# Patient Record
Sex: Female | Born: 1970 | Race: White | Hispanic: No | Marital: Single | Smoking: Never smoker
Health system: Southern US, Community
[De-identification: ages and names within clinical notes are randomized; demographics above are authoritative.]

## PROBLEM LIST (undated history)

## (undated) DIAGNOSIS — F419 Anxiety disorder, unspecified: Secondary | ICD-10-CM

## (undated) DIAGNOSIS — R609 Edema, unspecified: Secondary | ICD-10-CM

## (undated) DIAGNOSIS — C801 Malignant (primary) neoplasm, unspecified: Secondary | ICD-10-CM

## (undated) HISTORY — PX: CHOLECYSTECTOMY: SHX55

## (undated) HISTORY — PX: LAPAROSCOPIC GASTRIC SLEEVE RESECTION: SHX5895

## (undated) HISTORY — PX: KIDNEY CYST REMOVAL: SHX684

---

## 2008-10-05 ENCOUNTER — Ambulatory Visit: Payer: Self-pay | Admitting: Cardiology

## 2009-06-15 ENCOUNTER — Ambulatory Visit (HOSPITAL_COMMUNITY): Admission: RE | Admit: 2009-06-15 | Discharge: 2009-06-15 | Payer: Self-pay | Admitting: Urology

## 2009-06-22 ENCOUNTER — Encounter (INDEPENDENT_AMBULATORY_CARE_PROVIDER_SITE_OTHER): Payer: Self-pay | Admitting: Urology

## 2009-06-22 ENCOUNTER — Inpatient Hospital Stay (HOSPITAL_COMMUNITY): Admission: RE | Admit: 2009-06-22 | Discharge: 2009-06-25 | Payer: Self-pay | Admitting: Urology

## 2009-12-11 ENCOUNTER — Ambulatory Visit (HOSPITAL_COMMUNITY): Admission: RE | Admit: 2009-12-11 | Discharge: 2009-12-11 | Payer: Self-pay | Admitting: Urology

## 2010-07-07 ENCOUNTER — Ambulatory Visit (HOSPITAL_COMMUNITY): Admission: RE | Admit: 2010-07-07 | Discharge: 2010-07-07 | Payer: Self-pay | Admitting: Urology

## 2010-12-09 LAB — BASIC METABOLIC PANEL
BUN: 9 mg/dL (ref 6–23)
CO2: 29 mEq/L (ref 19–32)
Calcium: 8.4 mg/dL (ref 8.4–10.5)
Calcium: 8.6 mg/dL (ref 8.4–10.5)
Creatinine, Ser: 0.85 mg/dL (ref 0.4–1.2)
Creatinine, Ser: 1.08 mg/dL (ref 0.4–1.2)
GFR calc non Af Amer: 56 mL/min — ABNORMAL LOW (ref >60)
GFR calc non Af Amer: >60 mL/min (ref >60)
Glucose, Bld: 179 mg/dL — ABNORMAL HIGH (ref 70–99)
Glucose, Bld: 180 mg/dL — ABNORMAL HIGH (ref 70–99)
Glucose, Bld: 96 mg/dL (ref 70–99)
Potassium: 3.8 mEq/L (ref 3.5–5.1)
Sodium: 135 mEq/L (ref 135–145)
Sodium: 140 mEq/L (ref 135–145)

## 2010-12-09 LAB — CBC
HCT: 40.2 % (ref 36.0–46.0)
Hemoglobin: 13.6 g/dL (ref 12.0–15.0)
RDW: 13.7 % (ref 11.5–15.5)

## 2010-12-09 LAB — CREATININE, FLUID (PLEURAL, PERITONEAL, JP DRAINAGE): Creat, Fluid: 0.8 mg/dL

## 2010-12-09 LAB — TYPE AND SCREEN
ABO/RH(D): A NEG
Antibody Screen: NEGATIVE

## 2012-03-13 IMAGING — CR DG CHEST 2V
2 series · 2 of 2 positions shown · non-contrast
Comparison: Chest films 12/11/2009 and 06/15/2009.

CLINICAL DATA: History of renal cell carcinoma.

CHEST - 2 VIEW

[w chest pa]
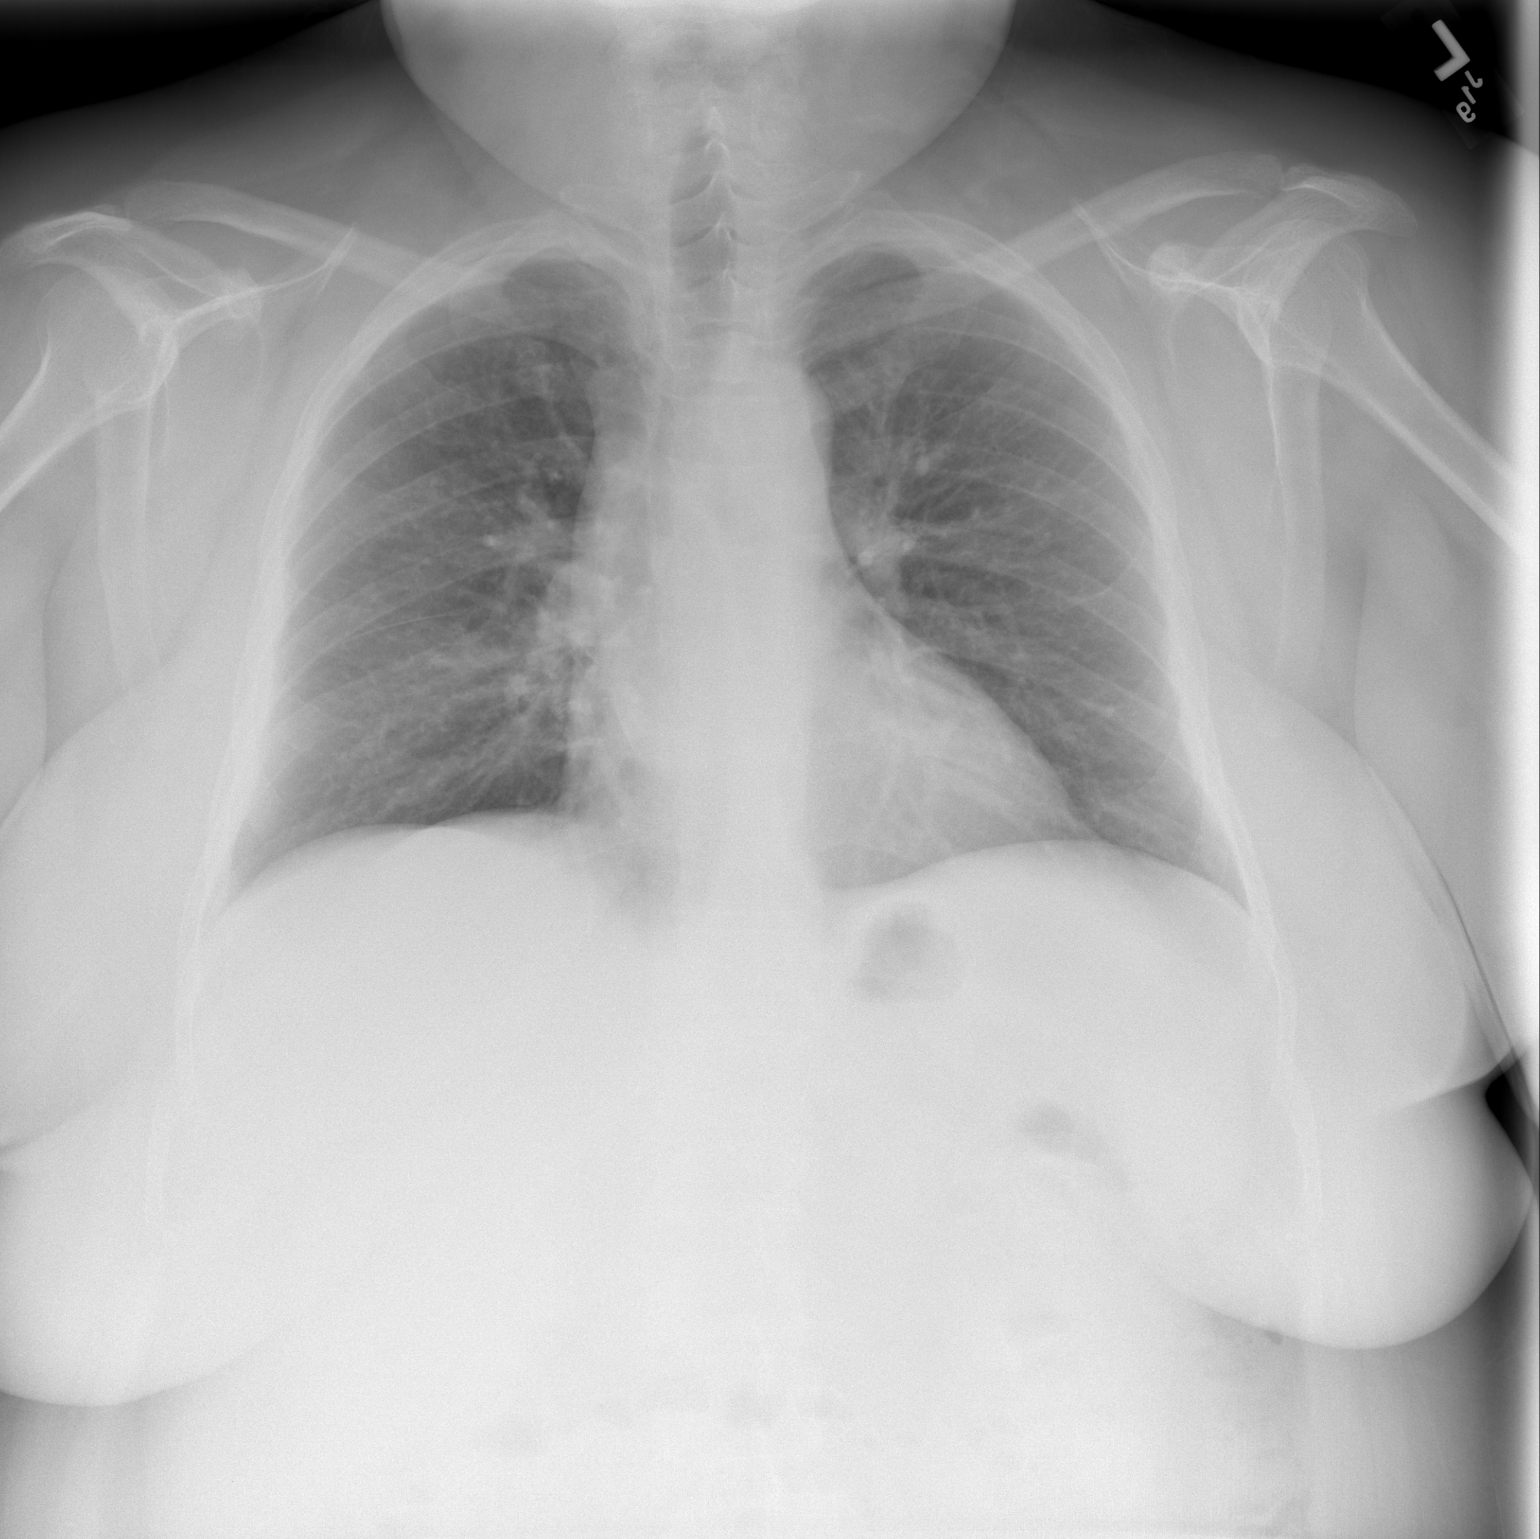

[w chest lat]
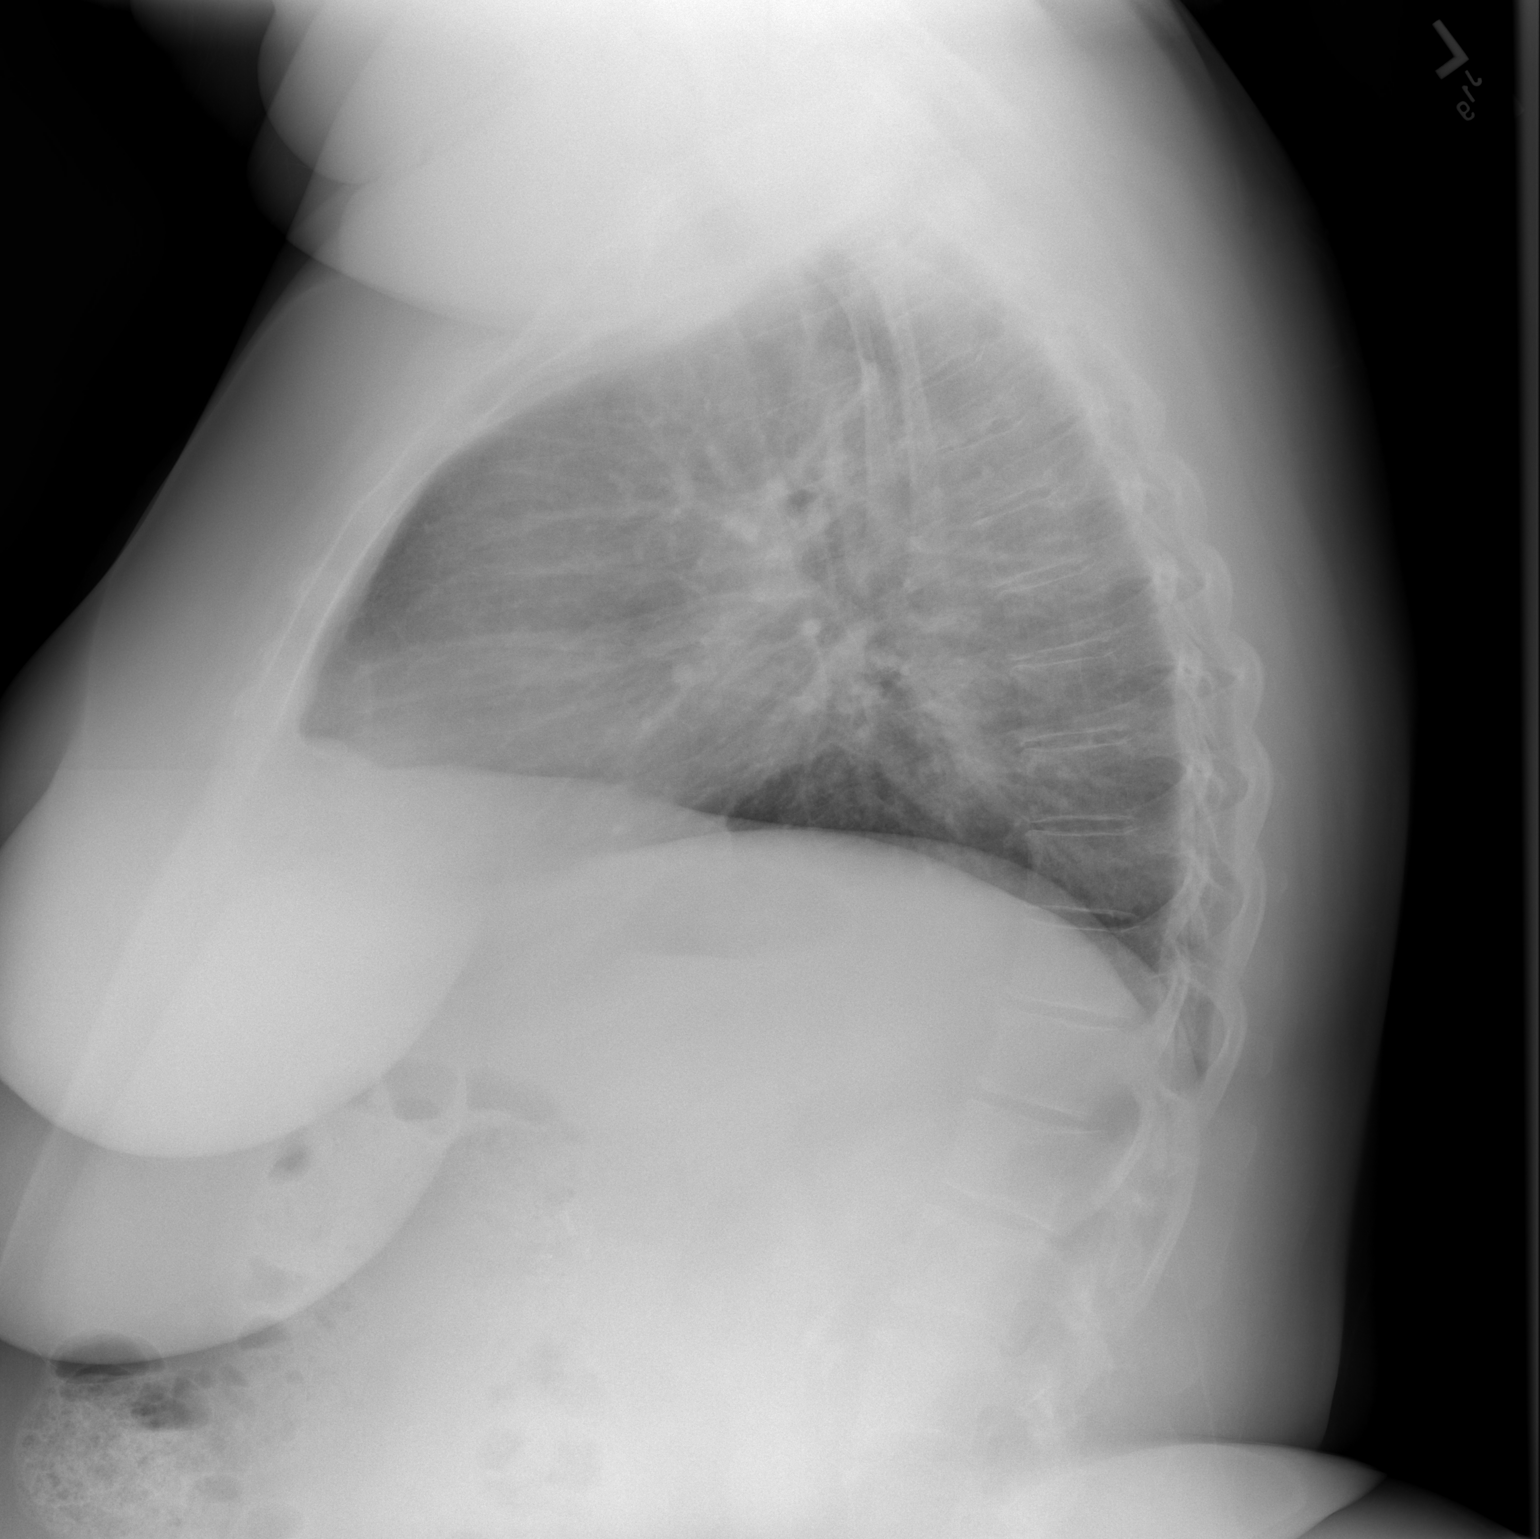

[2 of 2 positions shown; findings below may reference images not displayed]

FINDINGS: The lungs are clear.  Heart size normal.  There is no
pleural effusion or pneumothorax.  No focal bony abnormality.
IMPRESSION: Negative chest.

## 2016-10-28 ENCOUNTER — Emergency Department (HOSPITAL_COMMUNITY)
Admission: EM | Admit: 2016-10-28 | Discharge: 2016-10-28 | Disposition: A | Discharge status: Home / Self Care | Payer: BLUE CROSS/BLUE SHIELD | Attending: Emergency Medicine | Admitting: Emergency Medicine

## 2016-10-28 ENCOUNTER — Encounter (HOSPITAL_COMMUNITY): Payer: Self-pay | Admitting: Emergency Medicine

## 2016-10-28 ENCOUNTER — Emergency Department (HOSPITAL_COMMUNITY): Payer: BLUE CROSS/BLUE SHIELD

## 2016-10-28 DIAGNOSIS — Y999 Unspecified external cause status: Secondary | ICD-10-CM | POA: Insufficient documentation

## 2016-10-28 DIAGNOSIS — W231XXA Caught, crushed, jammed, or pinched between stationary objects, initial encounter: Secondary | ICD-10-CM | POA: Diagnosis not present

## 2016-10-28 DIAGNOSIS — S99922A Unspecified injury of left foot, initial encounter: Secondary | ICD-10-CM | POA: Diagnosis present

## 2016-10-28 DIAGNOSIS — Y939 Activity, unspecified: Secondary | ICD-10-CM | POA: Insufficient documentation

## 2016-10-28 DIAGNOSIS — S9032XA Contusion of left foot, initial encounter: Secondary | ICD-10-CM | POA: Insufficient documentation

## 2016-10-28 DIAGNOSIS — Y929 Unspecified place or not applicable: Secondary | ICD-10-CM | POA: Diagnosis not present

## 2016-10-28 HISTORY — DX: Anxiety disorder, unspecified: F41.9

## 2016-10-28 HISTORY — DX: Edema, unspecified: R60.9

## 2016-10-28 HISTORY — DX: Malignant (primary) neoplasm, unspecified: C80.1

## 2016-10-28 NOTE — ED Triage Notes (Signed)
Pt c/o left foot pain with ambulation after tripped over something and jamming her left foot great toe x2 days ago. PT ambulatory  in triage.

## 2016-10-28 NOTE — ED Provider Notes (Signed)
[facility] DEPT Provider Note   CSN: [id number] Arrival date & time: 10/28/16  1454     History   Chief Complaint Chief Complaint  Patient presents with  . Foot Injury    HPI Tammy Sullivan is a 46 y.o. female.  HPI   Tammy Sullivan is a 46 y.o. female who presents to the Emergency Department complaining of pain to her left foot and great toe after a mechical injury that occurred 2 days prior to arrival.  She describes a "jamming" injury to the toe with pain to the proximal joint.  She states that she is able to bear weight, but pain is reproduced with weight bearing on the toe.  Pain improves at rest.  She denies injury to the nail, numbness or swelling of the toe or foot.    Past Medical History:  Diagnosis Date  . Anxiety   . Cancer [facility])    kidney cancer  . Edema     There are no active problems to display for this patient.   Past Surgical History:  Procedure Laterality Date  . CHOLECYSTECTOMY    . KIDNEY CYST REMOVAL    . LAPAROSCOPIC GASTRIC SLEEVE RESECTION      OB History    No data available       Home Medications    Prior to Admission medications   Medication Sig Start Date End Date Taking? Authorizing Provider  ALPRAZolam (XANAX) 0.25 MG tablet Take 0.25 mg by mouth daily. 10/24/16   Historical Provider, MD    Family History History reviewed. No pertinent family history.  Social History Social History  Substance Use Topics  . Smoking status: Never Smoker  . Smokeless tobacco: Never Used  . Alcohol use No     Allergies   Patient has no known allergies.   Review of Systems Review of Systems  Constitutional: Negative for chills and fever.  Musculoskeletal: Positive for arthralgias (left great toe pain). Negative for joint swelling.  Skin: Negative for color change and wound.  All other systems reviewed and are negative.    Physical Exam Updated Vital Signs BP (!) 127/105 (BP Location: Left Arm)   Pulse 87   Temp  98.6 F (37 C)   Ht 5\' 3"  (1.6 m)   Wt 129.3 kg   LMP 10/17/2016   SpO2 99%   BMI 50.49 kg/m   Physical Exam  Constitutional: She is oriented to person, place, and time. She appears well-developed and well-nourished. No distress.  HENT:  Head: Normocephalic and atraumatic.  Cardiovascular: Normal rate, regular rhythm and intact distal pulses.   Pulmonary/Chest: Effort normal and breath sounds normal.  Musculoskeletal: She exhibits tenderness. She exhibits no edema or deformity.  ttp of the proximal left great toe. No edema or bruising. DP pulse is brisk,distal sensation intact.  No bony deformity, no injury to the nail.  No proximal tenderness.  Neurological: She is alert and oriented to person, place, and time. She exhibits normal muscle tone. Coordination normal.  Skin: Skin is warm and dry.  Nursing note and vitals reviewed.    ED Treatments / Results  Labs (all labs ordered are listed, but only abnormal results are displayed) Labs Reviewed - No data to display  EKG  EKG Interpretation None       Radiology Dg Foot Complete Left  Result Date: 10/28/2016 CLINICAL DATA:  The patient tripped over something and jammed her left great toe 2 days ago. Continued pain. Initial encounter. EXAM: LEFT  FOOT - COMPLETE 3+ VIEW COMPARISON:  None. FINDINGS: No acute bony or joint abnormality is seen. Hallux valgus deformity is noted. Tiny accessory ossicle off the navicular versus small calcification in the tibialis posterior tendon is noted. Small plantar calcaneal spur is seen. IMPRESSION: No acute finding. Hallux valgus. Small plantar calcaneal spur. Electronically Signed   By: Inge Rise M.D.   On: 10/28/2016 15:59    Procedures Procedures (including critical care time)  Medications Ordered in ED Medications - No data to display   Initial Impression / Assessment and Plan / ED Course  I have reviewed the triage vital signs and the nursing notes.  Pertinent labs & imaging  results that were available during my care of the patient were reviewed by me and considered in my medical decision making (see chart for details).     XR neg for fx.  NV intact. Pt has post op shoe at home.  Prefers to take OTC ibuprofen.  Agrees to RICE therapy, podiatry or orthopedic f/u if not improving  Final Clinical Impressions(s) / ED Diagnoses   Final diagnoses:  Contusion of left foot, initial encounter    New Prescriptions New Prescriptions   No medications on file     Kem Parkinson, PA-C 10/30/16 [place], DO 10/30/16 1531

## 2016-10-28 NOTE — Discharge Instructions (Signed)
Elevate and apply ice packs on/off to your foot.  Ibuprofen 600-800 mg three times a day with food.  Follow-up with the podiatrist in one week if not improving.

## 2017-12-18 ENCOUNTER — Emergency Department (HOSPITAL_COMMUNITY)
Admission: EM | Admit: 2017-12-18 | Discharge: 2017-12-19 | Disposition: A | Discharge status: Home / Self Care | Payer: BLUE CROSS/BLUE SHIELD | Attending: Emergency Medicine | Admitting: Emergency Medicine

## 2017-12-18 ENCOUNTER — Other Ambulatory Visit: Payer: Self-pay

## 2017-12-18 ENCOUNTER — Emergency Department (HOSPITAL_COMMUNITY): Payer: BLUE CROSS/BLUE SHIELD

## 2017-12-18 ENCOUNTER — Encounter (HOSPITAL_COMMUNITY): Payer: Self-pay | Admitting: Emergency Medicine

## 2017-12-18 DIAGNOSIS — Z79899 Other long term (current) drug therapy: Secondary | ICD-10-CM | POA: Diagnosis not present

## 2017-12-18 DIAGNOSIS — Z85528 Personal history of other malignant neoplasm of kidney: Secondary | ICD-10-CM | POA: Diagnosis not present

## 2017-12-18 DIAGNOSIS — J209 Acute bronchitis, unspecified: Secondary | ICD-10-CM | POA: Insufficient documentation

## 2017-12-18 DIAGNOSIS — J069 Acute upper respiratory infection, unspecified: Secondary | ICD-10-CM | POA: Diagnosis present

## 2017-12-18 MED ORDER — IPRATROPIUM-ALBUTEROL 0.5-2.5 (3) MG/3ML IN SOLN
3.0000 mL | Freq: Once | RESPIRATORY_TRACT | Status: AC
  Administered 2017-12-18: 3 mL via RESPIRATORY_TRACT

## 2017-12-18 MED ORDER — DEXAMETHASONE SODIUM PHOSPHATE 10 MG/ML IJ SOLN
10.0000 mg | Freq: Once | INTRAMUSCULAR | Status: AC
  Administered 2017-12-18: 10 mg via INTRAMUSCULAR
  Filled 2017-12-18: qty 1

## 2017-12-18 MED ORDER — ALBUTEROL (5 MG/ML) CONTINUOUS INHALATION SOLN
10.0000 mg/h | INHALATION_SOLUTION | Freq: Once | RESPIRATORY_TRACT | Status: AC
  Administered 2017-12-18: 10 mg/h via RESPIRATORY_TRACT
  Filled 2017-12-18: qty 20

## 2017-12-18 NOTE — ED Triage Notes (Signed)
Pt c/o cough, congestion that started 3 day ago, was seen at urgent care this past Saturday and was placed on an inhaler, allergra, cough medication, pt states that she had an old prescription of a z-pack and steroids at home that she started taking herself this past Thursday with no improvement,

## 2017-12-19 MED ORDER — HYDROCOD POLST-CPM POLST ER 10-8 MG/5ML PO SUER: 5.0000 mL | Freq: Two times a day (BID) | ORAL | 0 refills | Status: DC | PRN

## 2017-12-19 MED ORDER — ALBUTEROL SULFATE HFA 108 (90 BASE) MCG/ACT IN AERS
2.0000 | INHALATION_SPRAY | Freq: Once | RESPIRATORY_TRACT | Status: AC
  Administered 2017-12-19: 2 via RESPIRATORY_TRACT
  Filled 2017-12-19: qty 6.7

## 2017-12-19 MED ORDER — BENZONATATE 100 MG PO CAPS
200.0000 mg | ORAL_CAPSULE | Freq: Once | ORAL | Status: AC
  Administered 2017-12-19: 200 mg via ORAL
  Filled 2017-12-19: qty 2

## 2017-12-19 MED ORDER — PREDNISONE 20 MG PO TABS: 60.0000 mg | ORAL_TABLET | Freq: Every day | ORAL | 0 refills | Status: AC

## 2017-12-19 MED ORDER — BENZONATATE 100 MG PO CAPS: 200.0000 mg | ORAL_CAPSULE | Freq: Three times a day (TID) | ORAL | 0 refills | Status: DC | PRN

## 2017-12-19 NOTE — Discharge Instructions (Signed)
Take your next dose of prednisone this evening and for a total of 4 days.  Use your inhaler, 2 puffs every 4 hours for wheezing and coughing.  I recommend taking your mucinex in addition to drinking plenty of fluids to help better immobilize and expectorate your sinus drainage which I believe is the main source of your symptoms.  Continue taking your allegra.  You may also try the tessalon for cough suppression (does not cause drowsiness) and can take the tussionex cough syrup in place of the phenergan /codeine.

## 2017-12-19 NOTE — ED Notes (Signed)
Pt ambulated on room air- O2 sats remained between 94-96%. Pt reports being slightly winded but a lot better than when she came in. Pt can speak full sentences and no audible wheezing heard.

## 2017-12-19 NOTE — ED Provider Notes (Signed)
[facility] EMERGENCY DEPARTMENT Provider Note   CSN: [id number] Arrival date & time: 12/18/17  1952     History   Chief Complaint Chief Complaint  Patient presents with  . URI    HPI Tammy Sullivan is a 47 y.o. female with a history of uri type symptoms which includes nasal congestion with thick yellow nasal and PND, persistent sometimes productive cough along with wheezing and shortness of breath which has not improved despite multiple medications including otc's mucinex, allegra  and a course of zithromax and phenergan/codeine cough syrup when seen by an urgent care center 5 days ago.  She also had some leftover prednisone (of her mothers) 20 mg tabs which she took for 3 days without improvement.  Symptoms do not include  chest pain,  Nausea, vomiting or diarrhea.  Her symptoms are worse at night stating her post nasal drip is worse when supine causing her to choke.  She has been sleeping propped up to avoid this sx.    HPI  Past Medical History:  Diagnosis Date  . Anxiety   . Cancer [facility])    kidney cancer  . Edema     There are no active problems to display for this patient.   Past Surgical History:  Procedure Laterality Date  . CHOLECYSTECTOMY    . KIDNEY CYST REMOVAL    . LAPAROSCOPIC GASTRIC SLEEVE RESECTION       OB History   None      Home Medications    Prior to Admission medications   Medication Sig Start Date End Date Taking? Authorizing Provider  ALPRAZolam (XANAX) 0.25 MG tablet Take 0.25 mg by mouth daily. 10/24/16  Yes [provider]  torsemide (DEMADEX) 20 MG tablet Take 20 mg by mouth daily as needed (for fluid).  08/26/14  Yes [provider]  benzonatate (TESSALON) 100 MG capsule Take 2 capsules (200 mg total) by mouth 3 (three) times daily as needed. 12/19/17   Evalee Jefferson, PA-C  chlorpheniramine-HYDROcodone (TUSSIONEX PENNKINETIC ER) 10-8 MG/5ML SUER Take 5 mLs by mouth every 12 (twelve) hours as needed for cough. 12/19/17    Evalee Jefferson, PA-C  predniSONE (DELTASONE) 20 MG tablet Take 3 tablets (60 mg total) by mouth daily for 4 days. 12/19/17 12/23/17  Evalee Jefferson, PA-C    Family History No family history on file.  Social History Social History   Tobacco Use  . Smoking status: Never Smoker  . Smokeless tobacco: Never Used  Substance Use Topics  . Alcohol use: No  . Drug use: No     Allergies   Mirtazapine   Review of Systems Review of Systems  Constitutional: Negative for chills and fever.  HENT: Positive for congestion, postnasal drip and rhinorrhea. Negative for ear pain, sinus pressure, sore throat, trouble swallowing and voice change.   Eyes: Negative for discharge.  Respiratory: Positive for cough, shortness of breath and wheezing. Negative for stridor.   Cardiovascular: Negative for chest pain and leg swelling.  Gastrointestinal: Negative for abdominal pain, nausea and vomiting.  Genitourinary: Negative.      Physical Exam Updated Vital Signs BP 110/88 (BP Location: Left Arm)   Pulse 99   Temp 97.9 F (36.6 C) (Oral)   Resp 19   Ht 5\' 3"  (1.6 m)   Wt 133.8 kg (295 lb)   LMP 12/04/2017   SpO2 98%   BMI 52.26 kg/m   Physical Exam  Constitutional: She appears well-developed and well-nourished.  HENT:  Head: Normocephalic  and atraumatic.  Eyes: Conjunctivae are normal.  Neck: Normal range of motion.  Cardiovascular: Normal rate, regular rhythm, normal heart sounds and intact distal pulses.  Pulmonary/Chest: Effort normal. She has wheezes.  Prolonged expirations , expiratory wheeze all lung fields.  Abdominal: Soft. Bowel sounds are normal. There is no tenderness.  Musculoskeletal: Normal range of motion.  Neurological: She is alert.  Skin: Skin is warm and dry.  Psychiatric: She has a normal mood and affect.  Nursing note and vitals reviewed.    ED Treatments / Results  Labs (all labs ordered are listed, but only abnormal results are displayed) Labs Reviewed - No  data to display  EKG None  Radiology Dg Chest 2 View  Result Date: 12/18/2017 CLINICAL DATA:  47 y/o F; cough, congestion, wheezing since Wednesday. EXAM: CHEST - 2 VIEW COMPARISON:  05/08/2014 chest radiograph FINDINGS: Stable heart size and mediastinal contours are within normal limits. Reticular opacities, likely bronchitic changes. No focal consolidation, effusion, pneumothorax. The visualized skeletal structures are unremarkable. IMPRESSION: Bronchitic changes may represent bronchitis, reactive airways disease, or atypical pneumonia. No focal consolidation. Electronically Signed   By: Kristine Garbe M.D.   On: 12/18/2017 20:46    Procedures Procedures (including critical care time)  Medications Ordered in ED Medications  ipratropium-albuterol (DUONEB) 0.5-2.5 (3) MG/3ML nebulizer solution 3 mL (3 mLs Nebulization Given 12/18/17 2124)  albuterol (PROVENTIL,VENTOLIN) solution continuous neb (10 mg/hr Nebulization Given 12/18/17 2224)  dexamethasone (DECADRON) injection 10 mg (10 mg Intramuscular Given 12/18/17 2239)  albuterol (PROVENTIL HFA;VENTOLIN HFA) 108 (90 Base) MCG/ACT inhaler 2 puff (2 puffs Inhalation Given 12/19/17 0020)  benzonatate (TESSALON) capsule 200 mg (200 mg Oral Given 12/19/17 0025)     Initial Impression / Assessment and Plan / ED Course  I have reviewed the triage vital signs and the nursing notes.  Pertinent labs & imaging results that were available during my care of the patient were reviewed by me and considered in my medical decision making (see chart for details).     Pt cxr reviewed.  Exam and hx favor sinus congestion and drainage as primary source of cough and congestion. Diff diag including chf but no hx of this condition, no peripheral edema, no rales xray findings to suggest this. Doubt PE.   No fevers present.  Pt has just completed a zithromax course which is still covering her for possible atypicals per cxr, so no additional abx added at  this time.  She was given albuterol /atrovent neb prior to initial exam, she was still actively wheezing and reported no improvement so this was repeated over 1 hour.  Also given IM decadron. Discussed continuing home meds including mucinex and allegra.  She was given script for albuterol mdi by urgent care but did not fill due to cost, was given one from here with spacer and instructed in use. Prednisone 60 mg pulse dosing.  Rest, increased fluid intake while on mucinex.  Return precautions discussed.  Pt ambulated in dept with no desaturation. Prn f/u anticipated.   Final Clinical Impressions(s) / ED Diagnoses   Final diagnoses:  Acute bronchitis, unspecified organism    ED Discharge Orders        Ordered    benzonatate (TESSALON) 100 MG capsule  3 times daily PRN     12/19/17 0035    chlorpheniramine-HYDROcodone (TUSSIONEX PENNKINETIC ER) 10-8 MG/5ML SUER  Every 12 hours PRN     12/19/17 0035    predniSONE (DELTASONE) 20 MG tablet  Daily  12/19/17 0035       Evalee Jefferson, PA-C 12/19/17 3014    Virgel Manifold, MD 12/20/17 1537

## 2019-08-25 IMAGING — DX DG CHEST 2V
2 series · 2 of 2 positions shown · non-contrast
Comparison: 05/08/2014 chest radiograph

CLINICAL DATA: 46 y/o F; cough, congestion, wheezing since
[REDACTED].

EXAM:
CHEST - 2 VIEW

[chest pa]
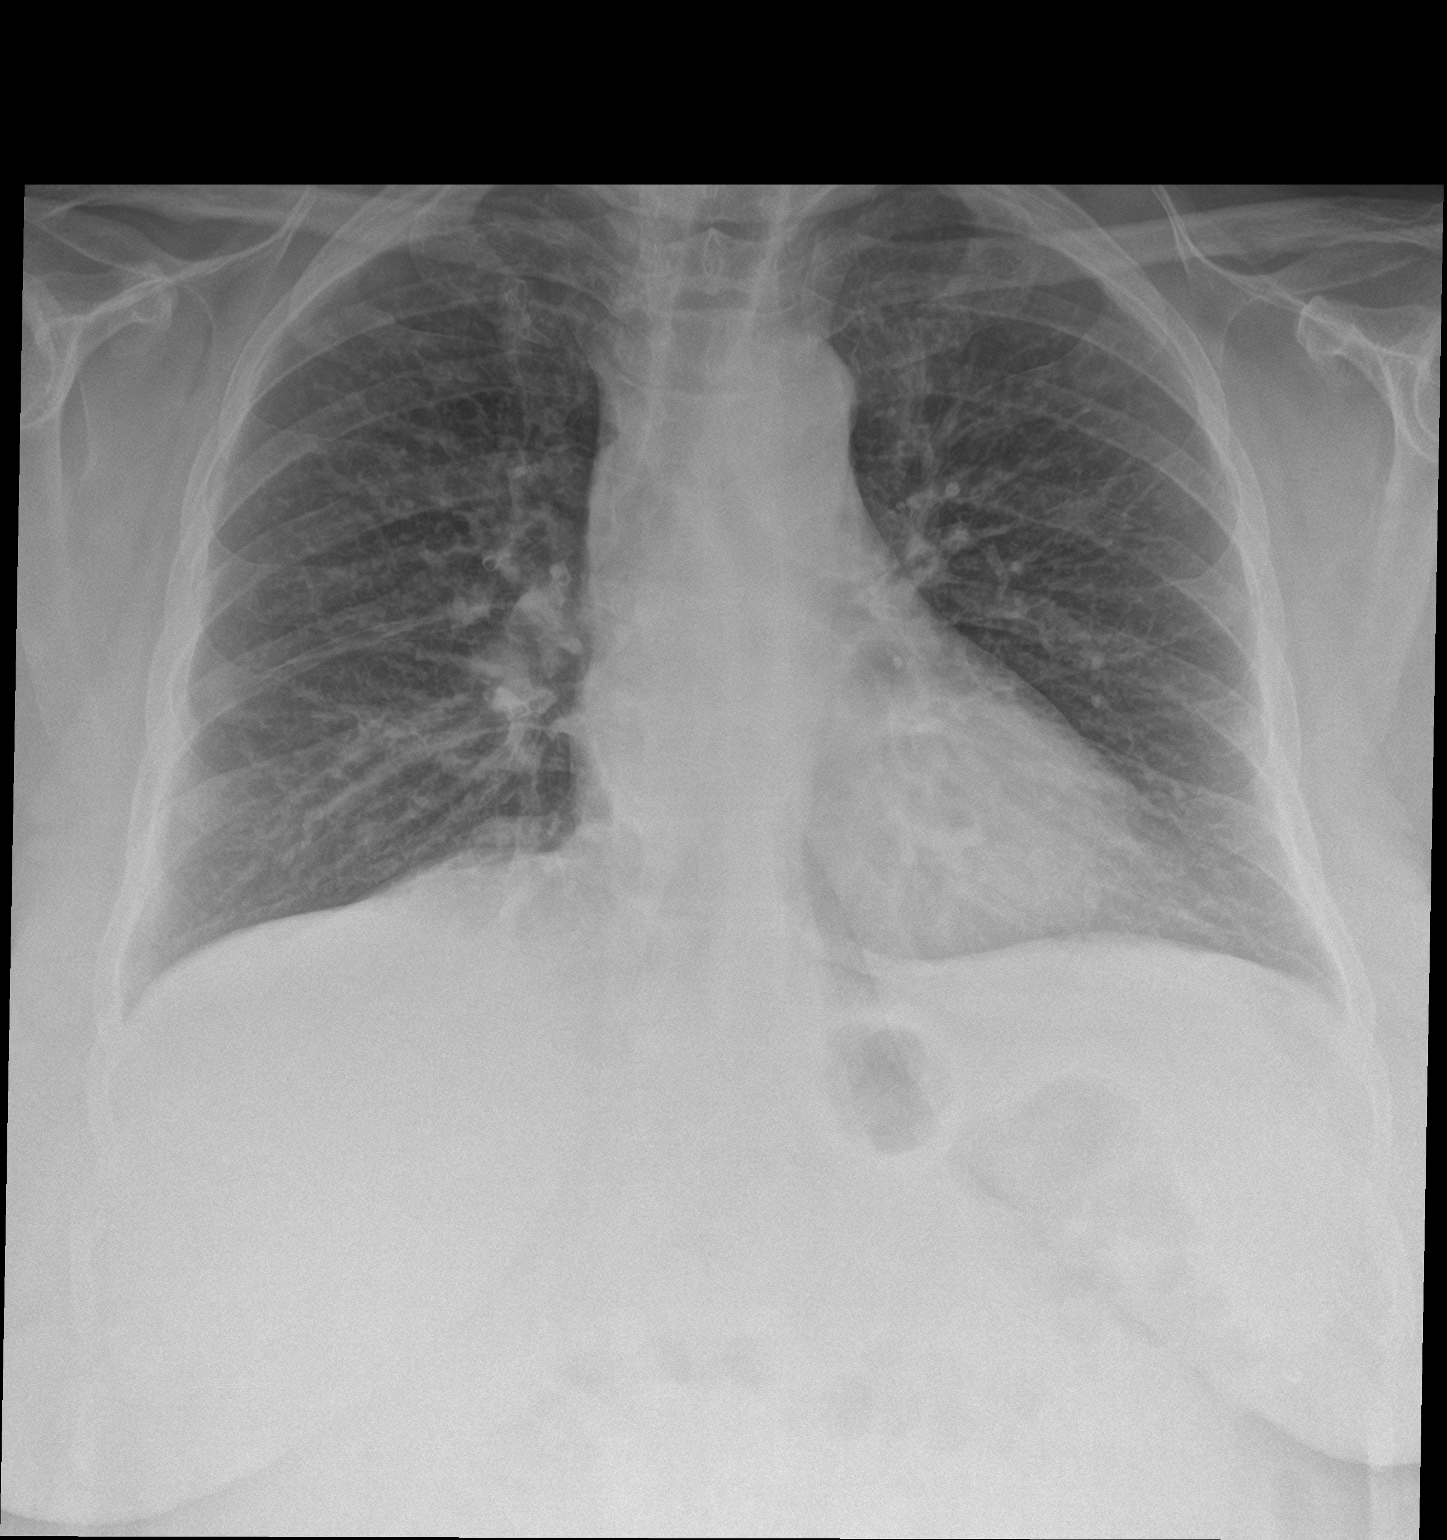

[chest lat]
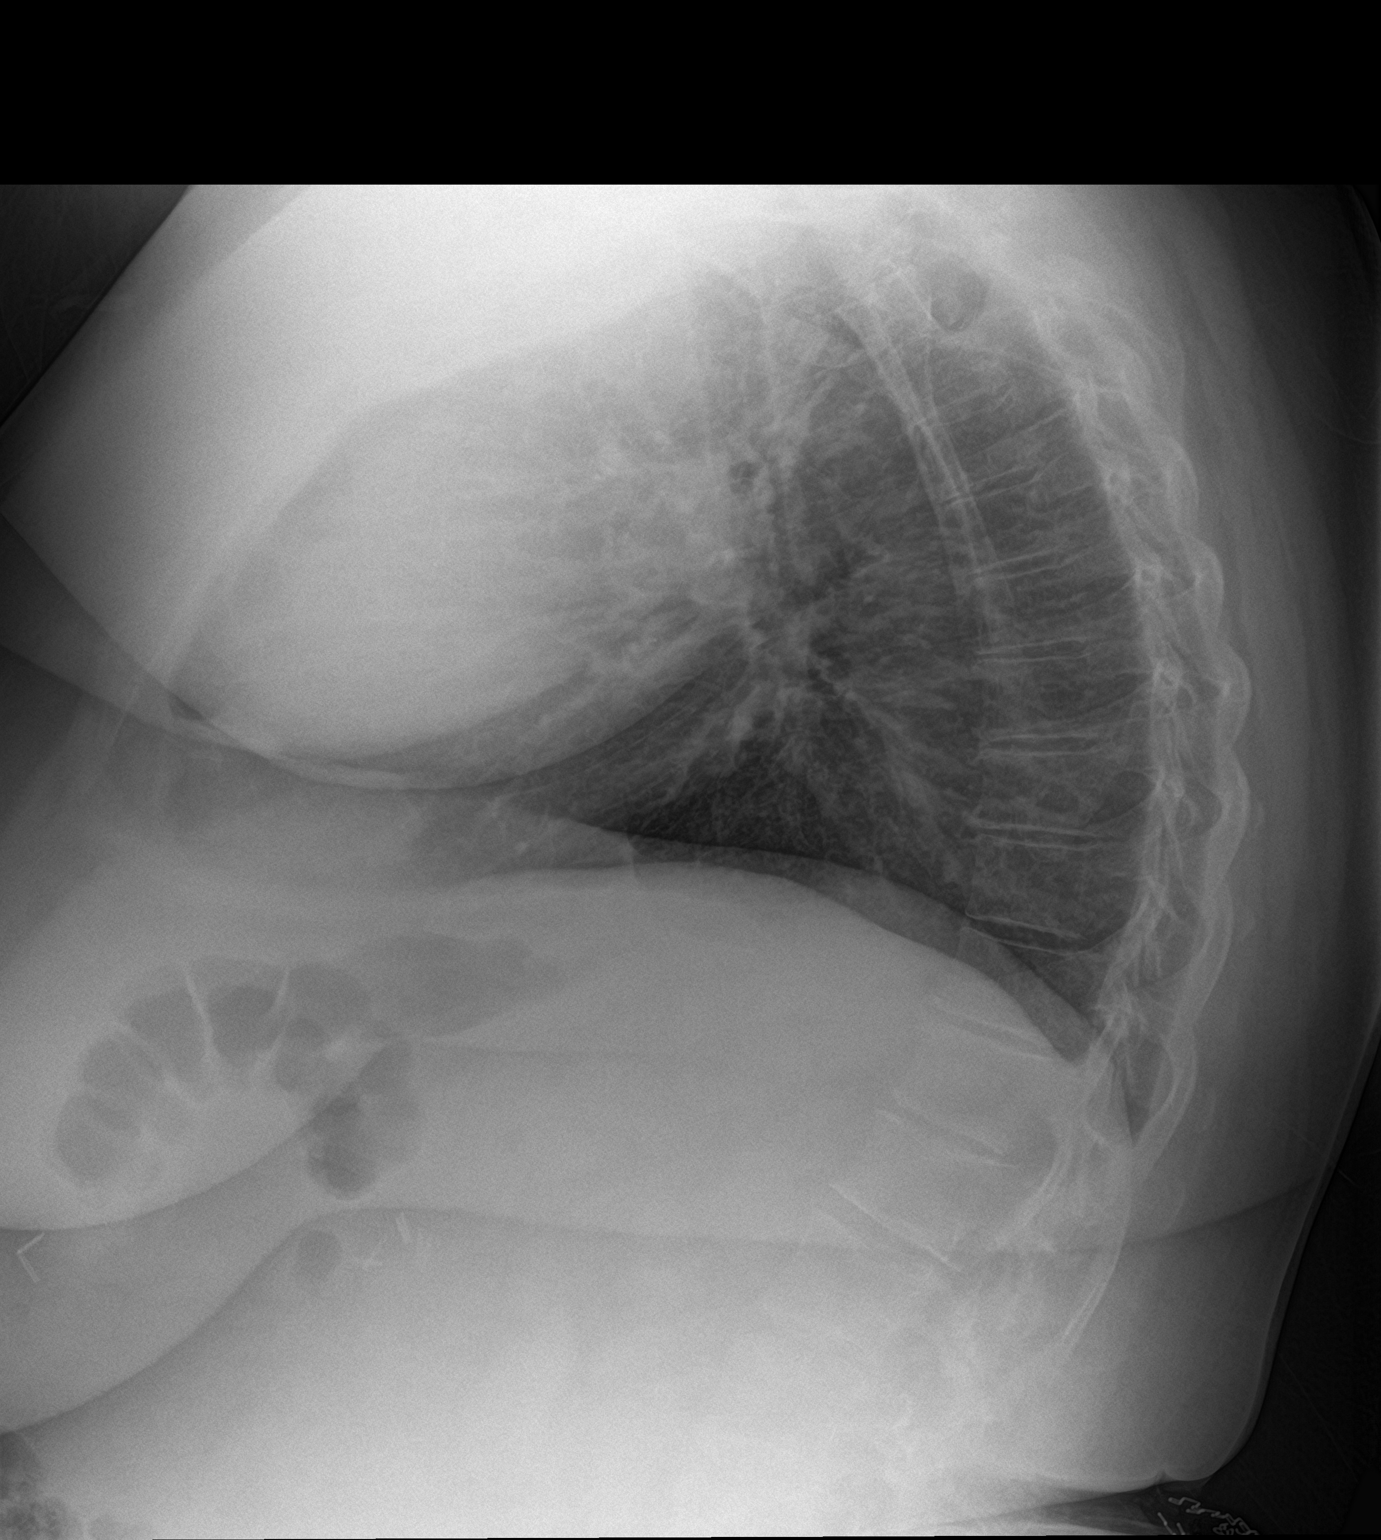

[2 of 2 positions shown; findings below may reference images not displayed]

FINDINGS: Stable heart size and mediastinal contours are within normal limits.
Reticular opacities, likely bronchitic changes. No focal
consolidation, effusion, pneumothorax. The visualized skeletal
structures are unremarkable.
IMPRESSION: Bronchitic changes may represent bronchitis, reactive airways
disease, or atypical pneumonia. No focal consolidation.

By: Ryo Loftin M.D.

## 2022-05-23 ENCOUNTER — Ambulatory Visit: Payer: Self-pay | Admitting: Family Medicine

## 2022-11-02 ENCOUNTER — Ambulatory Visit: Payer: Self-pay | Admitting: Family Medicine

## 2022-11-02 NOTE — Progress Notes (Deleted)
Subjective: [id number] care, *** HPI: Tammy Sullivan is a 52 y.o. female presenting to clinic today for:  1. ***  Past Medical History:  Diagnosis Date   Anxiety    Cancer ([facility])    kidney cancer   Edema    Past Surgical History:  Procedure Laterality Date   CHOLECYSTECTOMY     KIDNEY CYST REMOVAL     LAPAROSCOPIC GASTRIC SLEEVE RESECTION     Social History   Socioeconomic History   Marital status: Single    Spouse name: Not on file   Number of children: Not on file   Years of education: Not on file   Highest education level: Not on file  Occupational History   Not on file  Tobacco Use   Smoking status: Never   Smokeless tobacco: Never  Substance and Sexual Activity   Alcohol use: No   Drug use: No   Sexual activity: Not on file  Other Topics Concern   Not on file  Social History Narrative   Not on file   Social Determinants of Health   Financial Resource Strain: Not on file  Food Insecurity: Not on file  Transportation Needs: Not on file  Physical Activity: Not on file  Stress: Not on file  Social Connections: Not on file  Intimate Partner Violence: Not on file   No outpatient medications have been marked as taking for the 11/02/22 encounter (Appointment) with Janora Norlander, DO.   No family history on file. Allergies  Allergen Reactions   Mirtazapine Other (See Comments)    "jacked up"     Health Maintenance: ***  Flu Vaccine: {YES/NO/WILD [id number]  Tdap Vaccine: {YES/NO/WILD [id number]  - every 20yr - (<3 lifetime doses or unknown): all wounds -- look up need for Tetanus IG - (>=3 lifetime doses): clean/minor wound if >146yrfrom previous; all other wounds if >5y68yrrom previous Zoster Vaccine: {YES/NO/WILD [id number]} (those >50yo, once) Pneumonia Vaccine: {YES/NO/WILD [id number] w/ risk factors) - (<65y89yrth: Immunocompromised, cochlear implant, CSF leak, asplenic, sickle cell, Chronic Renal Failure - (<33yr26yrSV-23 only: Heart dz, lung disease, DM, tobacco abuse, alcoholism, cirrhosis/liver disease. - (>33yr)33yrV13 then PPSV23 in 6-12mths;  - (>33yr):33yrat PPSV23 once if pt received prior to 52yo and 7yrs ha69yrassed  ROS: Per HPI  Objective: Office vital signs reviewed. There were no vitals taken for this visit.  Physical Examination:  General: Awake, alert, *** nourished, No acute distress HEENT: Normal    Neck: No masses palpated. No lymphadenopathy    Ears: Tympanic membranes intact, normal light reflex, no erythema, no bulging    Eyes: PERRLA, extraocular movement in tact, sclera ***    Nose: nasal turbinates moist, *** nasal discharge    Throat: moist mucus membranes, no erythema, *** tonsillar exudate.  Airway is patent Cardio: regular rate and rhythm, S1S2 heard, no murmurs appreciated Pulm: clear to auscultation bilaterally, no wheezes, rhonchi or rales; normal work of breathing on room air GI: soft, non-tender, non-distended, bowel sounds present x4, no hepatomegaly, no splenomegaly, no masses GU: external vaginal tissue ***, cervix ***, *** punctate lesions on cervix appreciated, *** discharge from cervical os, *** bleeding, *** cervical motion tenderness, *** abdominal/ adnexal masses Extremities: warm, well perfused, No edema, cyanosis or clubbing; +*** pulses bilaterally MSK: *** gait and *** station Skin: dry; intact; no rashes or lesions Neuro: *** Strength and light touch sensation grossly intact, *** DTRs ***/4  Assessment/ Plan: 51 y.o. 77male   No  problem-specific Assessment & Plan notes found for this encounter.   Janora Norlander, DO [place] [phone]

## 2022-11-03 ENCOUNTER — Encounter: Payer: Self-pay | Admitting: Family Medicine

## 2023-05-26 ENCOUNTER — Ambulatory Visit: Payer: BLUE CROSS/BLUE SHIELD | Admitting: Obstetrics and Gynecology

## 2023-06-09 ENCOUNTER — Ambulatory Visit: Payer: BLUE CROSS/BLUE SHIELD | Admitting: Obstetrics and Gynecology

## 2023-07-31 ENCOUNTER — Other Ambulatory Visit: Payer: Self-pay | Admitting: *Deleted

## 2023-07-31 DIAGNOSIS — I878 Other specified disorders of veins: Secondary | ICD-10-CM

## 2023-08-14 ENCOUNTER — Ambulatory Visit (HOSPITAL_COMMUNITY): Payer: BC Managed Care – PPO

## 2023-08-23 ENCOUNTER — Ambulatory Visit: Payer: BLUE CROSS/BLUE SHIELD | Admitting: Obstetrics and Gynecology

## 2023-09-04 ENCOUNTER — Ambulatory Visit (HOSPITAL_COMMUNITY): Payer: BC Managed Care – PPO

## 2023-10-27 NOTE — Progress Notes (Signed)
 ------------------------------------------------------------------------------- Summary: Venous stasis disease -------------------------------------------------------------------------------  Tammy Sullivan 52 y.o. September 24, 1970 Phone: [phone] (home)  Address: [address] [place] [address]  MRN: [id number] Primary MD : Tammy Sullivan  [facility]    Problem List Items Addressed This Visit       Other   Venous stasis ulcer of right lower extremity (CMS-HCC) - Primary   No open wounds present on today's visit. We will refer the patient to dermatology for treatment of the venous stasis dermatitis. We will prescribe CircAid compression systems which must be worn daily to help control edema. Refer to lymphedema for edema control Weight loss strongly encouraged       Wound Consultation Note  Past Medical History:  Diagnosis Date  . Asthma   . Atypical chest pain    neg cardiology eval in hosp 2/09  . Benign essential HTN   . BMI 60.0-69.9, adult (CMS-HCC)   . Cancer (CMS-HCC)    kidney, 1/3 rt kidney removed  . Cellulitis of right lower leg 07/2016  . Cellulitis of right lower leg 07/2016  . Chronic allergic rhinitis   . GERD (gastroesophageal reflux disease)   . History of kidney cancer 2010  . HTN (hypertension)   . Intermittent asthma without complication   . LLL pneumonia   . 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 Mild intermittent acute asthmatic bronchitis   . Morbid obesity (CMS-HCC)   . Morbid obesity with BMI of 50.0-59.9, adult (CMS-HCC)   . Overactive bladder   . Primary insomnia   . Pulmonary embolism (CMS-HCC)   . Restless leg syndrome   . Venous insufficiency of both lower extremities   . Vitamin D deficiency    Patient Active Problem List  Diagnosis  . Venous insufficiency of both lower extremities  . Primary insomnia  . Morbid obesity with BMI of 50.0-59.9, adult (CMS-HCC)  .  Chronic allergic rhinitis  . Benign essential HTN  . Mild intermittent acute asthmatic bronchitis  . Panic disorder  . Morbid obesity (CMS-HCC)  . 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 Anxiety  . Depression  . Vitamin D deficiency  . Restless leg syndrome  . Pulmonary embolism (CMS-HCC)  . Overactive bladder  . LLL pneumonia  . HTN (hypertension)  . GERD (gastroesophageal reflux disease)  . Cellulitis of right lower leg  . Cancer (CMS-HCC)  . Atypical chest pain  . Asthma  . Chronic acquired lymphedema  . Venous stasis ulcer of right lower extremity (CMS-HCC)   Past Surgical History:  Procedure Laterality Date  . CHOLECYSTECTOMY  1990  . KIDNEY SURGERY  2010   1/3 rt kidney removed due to cancer  . LAPAROSCOPIC PARTIAL GASTRECTOMY  08/2014   with paraesophageal hernia repair (Gastric Sleeve)  . NEPHRECTOMY Right 06/2009   robotic resection stage II right renal cell carcinoma   Allergies  Allergen Reactions  . Lisinopril   . Mirtazapine Other (See Comments)    jacked up   Meds:  Current Outpatient Medications:  .  acetaminophen-codeine (TYLENOL #3) 300-30 mg per tablet, TAKE 1 TABLET BY MOUTH TWO (2) TIMES A DAY AS NEEDED FOR PAIN,SEVERE (7-10)., Disp: 60 tablet, Rfl: 0 .  albuterol HFA 90 mcg/actuation inhaler, Inhale 2 puffs every six (6) hours as needed for wheezing., Disp: , Rfl:  .  ALPRAZolam (XANAX) 0.5 MG tablet, TAKE 1 TABLET BY MOUTH TWO TIMES A DAY AS NEEDED FOR ANXIETY, Disp: 60  tablet, Rfl: 0 .  amoxicillin-clavulanate (AUGMENTIN) 875-125 mg per tablet, Take 1 tablet by mouth two (2) times a day., Disp: , Rfl:  .  ascorbic acid, vitamin C, (VITAMIN C) 1,000 mg TbER, Take 1,000 mg by mouth two (2) times a day., Disp: 90 tablet, Rfl: 6 .  budesonide-formoterol (SYMBICORT) 80-4.5 mcg/actuation inhaler, Inhale 2 puffs daily., Disp: , Rfl:  .  cholecalciferol, vitamin D3-1,250 mcg, 50,000 unit,, 1,250 mcg (50,000 unit) capsule, TAKE 1 CAPSULE  BY MOUTH ONE TIME PER WEEK FOR 90 DAYS (Patient not taking: Reported on 05/16/2023), Disp: , Rfl:  .  ELIQUIS 5 mg Tab, TAKE 1 TABLET BY MOUTH TWICE A DAY, Disp: 60 tablet, Rfl: 0 .  furosemide (LASIX) 20 MG tablet, TAKE 1 TABLET BY MOUTH EVERY DAY (Patient taking differently: Take 2 tablets (40 mg total) by mouth daily.), Disp: 30 tablet, Rfl: 11 .  losartan (COZAAR) 25 MG tablet, Take 1 tablet (25 mg total) by mouth daily., Disp: , Rfl:  .  metFORMIN (GLUCOPHAGE) 500 MG tablet, Take 1 tablet (500 mg total) by mouth in the morning and 1 tablet (500 mg total) in the evening. Take with meals., Disp: , Rfl:  .  montelukast (SINGULAIR) 10 mg tablet, TAKE 1 TABLET BY MOUTH EVERY DAY AT NIGHT, Disp: 30 tablet, Rfl: 5 .  MYRBETRIQ 25 mg Tb24 extended-release tablet, TAKE 1 TABLET BY MOUTH EVERY DAY FOR 30 DAYS, Disp: , Rfl:  .  omeprazole (PRILOSEC) 20 MG capsule, TAKE 1 CAPSULE BY MOUTH EVERY DAY 30 MINUTES BEFORE MORNING MEAL FOR 30 DAYS, Disp: , Rfl:  .  pregabalin (LYRICA) 50 MG capsule, Take 1 capsule (50 mg total) by mouth Three (3) times a day. (Patient not taking: Reported on 05/16/2023), Disp: 90 capsule, Rfl: 0 .  rOPINIRole (REQUIP) 0.25 MG tablet, Take 1 tablet (0.25 mg total) by mouth Three (3) times a day., Disp: , Rfl:  .  zinc acetate 50 mg (zinc) cap, Take 50 mg by mouth in the morning., Disp: 90 capsule, Rfl: 1 SocHx:  reports that she quit smoking about 17 years ago. Her smoking use included cigarettes. She started smoking about 37 years ago. She has a 10 pack-year smoking history. She has been exposed to tobacco smoke. She has never used smokeless tobacco. She reports current drug use. Frequency: 1.00 time per week. She reports that she does not drink alcohol. FamHx: She indicated that her mother is alive. She indicated that her father is alive. She indicated that her brother is alive. She indicated that her daughter is alive.   Special Needs: No special needs identified History of  Present Illness: Reason for Consult: Wound Consult  Tammy Sullivan is a very unfortunate 53 y.o. year old female who is seen in consultation for evaluation of ulceration anterior surface right lower extremity Duration: July 2024 Location: Right lower extremity anterior tibial surface Severity: moderate Aggravating Factors: Venous Stasis Disease, chronic lymphedema, morbid obesity Culture Data: 05/16/2023   Methicillin-Susceptible Staphylococcus aureus Carbapenem Resistant Enterobacter cloacae complex   MIC SUSCEPTIBILITY RESULT KIRBY BAUER MIC SUSCEPTIBILITY RESULT KIRBY BAUER   Amoxicillin + Clavulanate    Resistant    Ampicillin    Resistant    Ampicillin + Sulbactam    Resistant    Aztreonam    Resistant    Cefazolin    Resistant    Cefepime    Susceptible-Dose Dependent 1    Ceftazidime    Resistant    Ceftriaxone  Resistant    Ciprofloxacin    Susceptible    Clindamycin   Susceptible     Doxycycline   Intermediate     Ertapenem     Resistant   Erythromycin   Resistant     Fluoroquinolone   No Interpretation 2     Gentamicin   Susceptible 3 Susceptible    Levofloxacin    Susceptible    Meropenem    Susceptible    Nafcillin   Susceptible 4     Piperacillin + Tazobactam    Resistant    Tetracycline    Susceptible 5    Tobramycin    Susceptible    Trimethoprim + Sulfamethoxazole   Susceptible Susceptible    Vancomycin 1 Susceptible      Vascular studies: (-) DVT 05/09/2023 Right ABI:  0.91  Right TBI: 0.42  Left ABI:  0.92  LeftTBI: 0.61   Ankle-brachial indices indicative of borderline bilateral lower  extremity arterial occlusive disease.   Imaging: None Previous treatment: Oral antibiotics outpatient, Mupirocin, Debridement, Hydrofera Blue, Kelrix, Ace (MWF)  Patient has been lost to follow-up for several months.  She returns to clinic today.  The patient is unable to tolerate any form of compression wraps secondary to pain and itching.  Patient's body  habitus also makes it difficult for wrap applications.  I feel the patient would benefit significantly from Unna boots however this will be challenging giving her past history with improving wraps. The patient has been placing the unna boot gauze over the extremity but not wrapping it around the leg.  There is effectively been no edema control.  Despite this the wound is healed.  But the venous stasis dermatitis remains.  Patient complains of pain in the lower extremity.  There is no fever or chills heavy drainage or weeping present on today's visit.  No open visible lesions present.  Review of Systems  Constitutional:  Negative for chills, fever, malaise/fatigue and weight loss.  HENT:  Negative for congestion and sore throat.   Eyes:  Negative for double vision, photophobia and redness.  Respiratory:  Negative for cough, hemoptysis, shortness of breath, wheezing and stridor.   Cardiovascular:  Positive for leg swelling. Negative for chest pain, palpitations, orthopnea and claudication.  Gastrointestinal:  Negative for abdominal pain, blood in stool, constipation, diarrhea, melena, nausea and vomiting.  Genitourinary:  Negative for frequency, hematuria and urgency.  Musculoskeletal:  Negative for joint pain.  Skin:  Negative for itching and rash.       Ulceration right lower extremity  Neurological:  Negative for dizziness, tremors, focal weakness, seizures, loss of consciousness, weakness and headaches.  Endo/Heme/Allergies:  Negative for polydipsia. Does not bruise/bleed easily.  Psychiatric/Behavioral:  Negative for memory loss and suicidal ideas. The patient does not have insomnia.    BP 181/92   Pulse 95   Temp 37.2 C (98.9 F)   Resp 18   SpO2 96%   Physical Exam Constitutional:      General: She is not in acute distress.    Appearance: She is well-developed. She is morbidly obese. She is not diaphoretic.  HENT:     Head: Normocephalic and atraumatic.     Right Ear: External ear  normal.     Left Ear: External ear normal.     Nose: Nose normal.     Mouth/Throat:     Pharynx: No oropharyngeal exudate.  Eyes:     General: No scleral icterus.  Right eye: No discharge.        Left eye: No discharge.     Conjunctiva/sclera: Conjunctivae normal.     Pupils: Pupils are equal, round, and reactive to light.  Neck:     Thyroid: No thyromegaly.     Vascular: No JVD.     Trachea: No tracheal deviation.  Cardiovascular:     Rate and Rhythm: Normal rate and regular rhythm.     Heart sounds: Normal heart sounds. No murmur heard.    No friction rub. No gallop.  Pulmonary:     Effort: Pulmonary effort is normal. No respiratory distress.     Breath sounds: Normal breath sounds. No stridor. No wheezing or rales.  Chest:     Chest wall: No tenderness.  Abdominal:     General: Bowel sounds are normal. There is no distension.     Palpations: Abdomen is soft. There is no mass.     Tenderness: There is no abdominal tenderness. There is no guarding or rebound.     Hernia: No hernia is present.  Musculoskeletal:        General: No tenderness or deformity. Normal range of motion.     Cervical back: Normal range of motion and neck supple.       Legs:     Comments: Venous stasis dermatitis RLE No open wounds currently present Previously 5.3*7.0*0.0 cm (-) Slough (excess Mupirocin application) (-) Cellulitis (-) Purulent drainage (-) Necrosis/gangrene   Lymphadenopathy:     Cervical: No cervical adenopathy.  Skin:    General: Skin is warm and dry.     Coloration: Skin is not pale.     Findings: No erythema or rash.  Neurological:     Mental Status: She is alert and oriented to person, place, and time.     Cranial Nerves: No cranial nerve deficit.     Motor: No abnormal muscle tone.     Coordination: Coordination normal.  Psychiatric:        Behavior: Behavior normal.        Thought Content: Thought content normal.        Judgment: Judgment normal.     IMAGES:  05/16/2023   08/22/2023   2/21/20025    Lab Results  Component Value Date   WBC 10.1 07/17/2020   HGB 11.1 (L) 07/17/2020   HCT 34.6 07/17/2020   PLT 238 07/17/2020    Lab Results  Component Value Date   NA 135 07/17/2020   K 3.1 (L) 07/17/2020   CL 99 07/17/2020   CO2 27.3 07/17/2020   BUN 13 07/17/2020   CREATININE 1.23 (H) 07/17/2020   GLU 214 (H) 07/17/2020   CALCIUM 8.2 (L) 07/17/2020   MG 1.9 07/17/2020    Lab Results  Component Value Date   BILITOT 0.4 07/17/2020   PROT 7.1 07/17/2020   ALBUMIN 2.5 (L) 07/17/2020   ALT 14 07/17/2020   AST 17 07/17/2020   ALKPHOS 101 07/17/2020    Lab Results  Component Value Date   INR 1.0 11/23/2019   APTT 28.1 07/25/2016

## 2023-11-10 ENCOUNTER — Ambulatory Visit (HOSPITAL_COMMUNITY): Payer: BC Managed Care – PPO

## 2023-11-10 ENCOUNTER — Encounter: Payer: BC Managed Care – PPO | Admitting: Vascular Surgery

## 2024-01-12 ENCOUNTER — Encounter (HOSPITAL_COMMUNITY)

## 2024-01-12 ENCOUNTER — Encounter: Admitting: Vascular Surgery

## 2024-04-01 ENCOUNTER — Other Ambulatory Visit: Payer: Self-pay

## 2024-04-01 DIAGNOSIS — I878 Other specified disorders of veins: Secondary | ICD-10-CM

## 2024-04-18 ENCOUNTER — Ambulatory Visit (HOSPITAL_COMMUNITY): Admission: RE | Admit: 2024-04-18 | Source: Ambulatory Visit

## 2024-04-18 ENCOUNTER — Ambulatory Visit (HOSPITAL_COMMUNITY): Attending: Vascular Surgery | Admitting: Vascular Surgery
